# Patient Record
Sex: Female | Born: 1950 | Race: Black or African American | Hispanic: No | Smoking: Current every day smoker
Health system: Southern US, Community
[De-identification: ages and names within clinical notes are randomized; demographics above are authoritative.]

## PROBLEM LIST (undated history)

## (undated) DIAGNOSIS — R7611 Nonspecific reaction to tuberculin skin test without active tuberculosis: Secondary | ICD-10-CM

## (undated) DIAGNOSIS — I10 Essential (primary) hypertension: Secondary | ICD-10-CM

## (undated) DIAGNOSIS — N6009 Solitary cyst of unspecified breast: Secondary | ICD-10-CM

## (undated) DIAGNOSIS — F32A Depression, unspecified: Secondary | ICD-10-CM

## (undated) DIAGNOSIS — L4050 Arthropathic psoriasis, unspecified: Secondary | ICD-10-CM

## (undated) DIAGNOSIS — E669 Obesity, unspecified: Secondary | ICD-10-CM

## (undated) DIAGNOSIS — J310 Chronic rhinitis: Secondary | ICD-10-CM

## (undated) DIAGNOSIS — J45909 Unspecified asthma, uncomplicated: Secondary | ICD-10-CM

## (undated) DIAGNOSIS — K449 Diaphragmatic hernia without obstruction or gangrene: Secondary | ICD-10-CM

## (undated) DIAGNOSIS — G4730 Sleep apnea, unspecified: Secondary | ICD-10-CM

## (undated) DIAGNOSIS — L309 Dermatitis, unspecified: Secondary | ICD-10-CM

## (undated) DIAGNOSIS — L409 Psoriasis, unspecified: Secondary | ICD-10-CM

## (undated) DIAGNOSIS — E0500 Thyrotoxicosis with diffuse goiter without thyrotoxic crisis or storm: Secondary | ICD-10-CM

## (undated) DIAGNOSIS — F419 Anxiety disorder, unspecified: Secondary | ICD-10-CM

## (undated) DIAGNOSIS — F329 Major depressive disorder, single episode, unspecified: Secondary | ICD-10-CM

## (undated) DIAGNOSIS — K219 Gastro-esophageal reflux disease without esophagitis: Secondary | ICD-10-CM

## (undated) DIAGNOSIS — D259 Leiomyoma of uterus, unspecified: Secondary | ICD-10-CM

## (undated) HISTORY — PX: TUBAL LIGATION: SHX77

## (undated) HISTORY — PX: ABDOMINAL HYSTERECTOMY: SHX81

## (undated) HISTORY — PX: NM I-131 ABLATION W/THYROGEN (ARMC HX): HXRAD1231

## (undated) HISTORY — PX: COLONOSCOPY WITH ESOPHAGOGASTRODUODENOSCOPY (EGD): SHX5779

## (undated) HISTORY — PX: CHOLECYSTECTOMY: SHX55

## (undated) HISTORY — PX: APPENDECTOMY: SHX54

---

## 2004-01-03 ENCOUNTER — Ambulatory Visit: Payer: Self-pay | Admitting: Internal Medicine

## 2005-02-05 ENCOUNTER — Ambulatory Visit: Payer: Self-pay | Admitting: Internal Medicine

## 2006-03-11 ENCOUNTER — Ambulatory Visit: Payer: Self-pay | Admitting: Internal Medicine

## 2007-03-31 ENCOUNTER — Ambulatory Visit: Payer: Self-pay | Admitting: Internal Medicine

## 2008-10-28 ENCOUNTER — Emergency Department: Payer: Self-pay | Admitting: Emergency Medicine

## 2008-11-09 ENCOUNTER — Ambulatory Visit: Payer: Self-pay | Admitting: Urology

## 2009-07-18 ENCOUNTER — Ambulatory Visit: Payer: Self-pay | Admitting: Internal Medicine

## 2010-07-05 IMAGING — CT CT ABD-PELV W/O CM
1 of 3 series · 14 of 32 positions shown, 19 images · non-contrast
Comparison: None

REASON FOR EXAM: (1) pain  oral contrast only  no IV; (2) Erxleben
COMMENTS:

PROCEDURE:     CT  - CT ABDOMEN AND PELVIS W[DATE]  [DATE]
RESULT:     Indication: Pain
TECHNIQUE: Multiple axial images from the lung bases to the symphysis pubis
were obtained with oral and without intravenous contrast.

[Series 5: 3mm · axial · 0.83mm/px · z∈[+131,+530]mm · 14 of 151 slices shown, 19 images]
[im 9/151  soft-tissue]
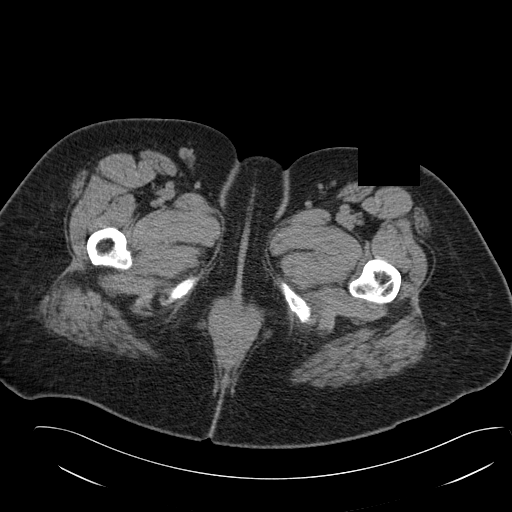
[im 9/151  bone]
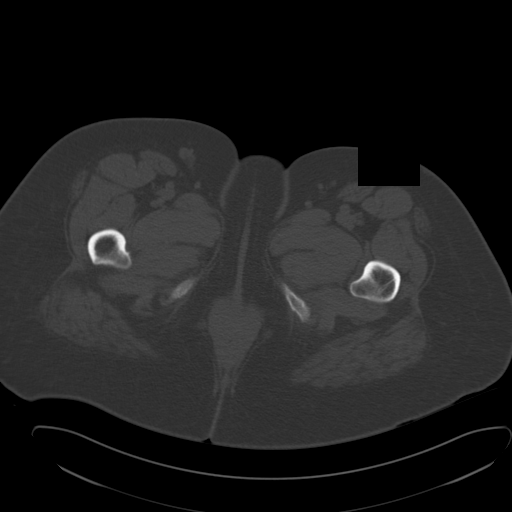
[im 18/151  soft-tissue]
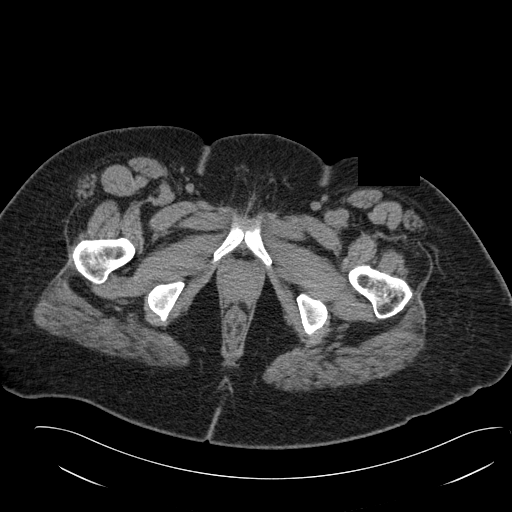
[im 36/151  soft-tissue]
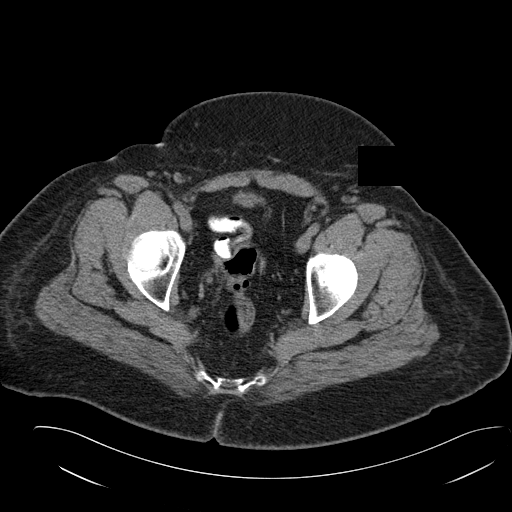
[im 45/151  soft-tissue]
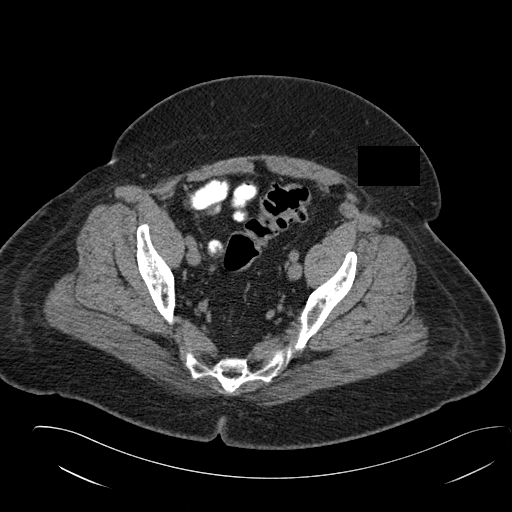
[im 53/151  soft-tissue]
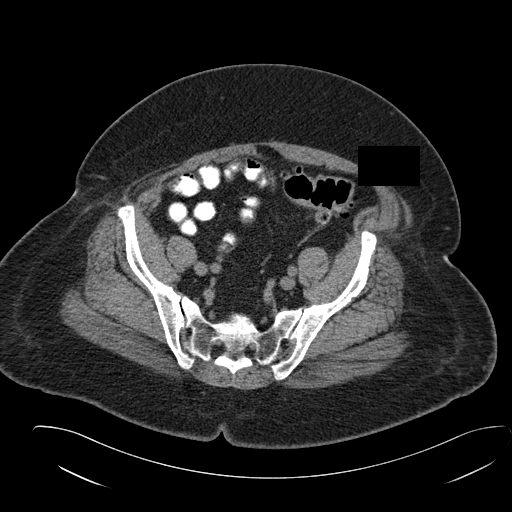
[im 62/151  soft-tissue]
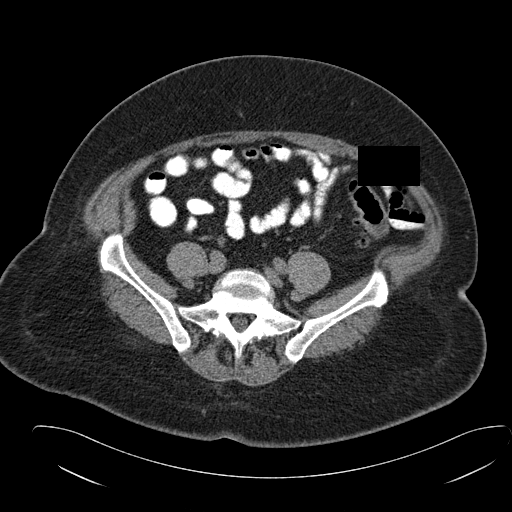
[im 80/151  soft-tissue]
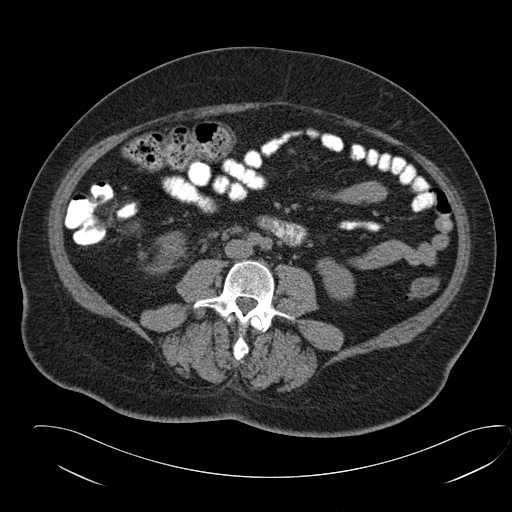
[im 89/151  soft-tissue]
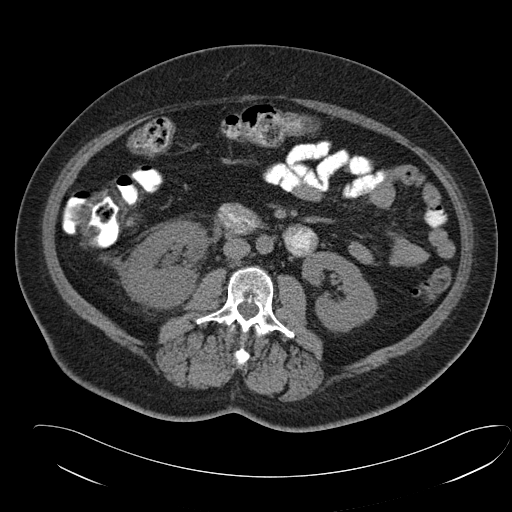
[im 98/151  soft-tissue]
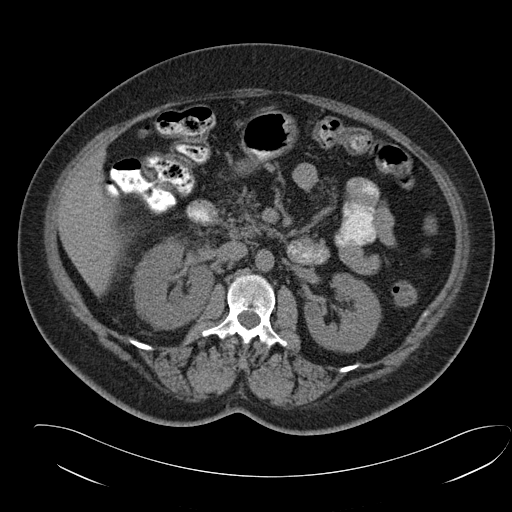
[im 98/151  bone]
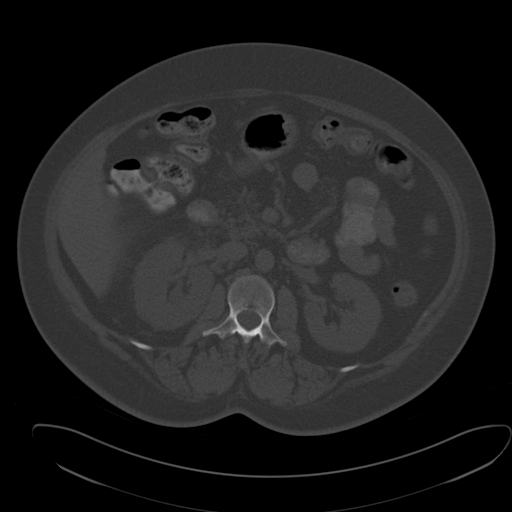
[im 106/151  soft-tissue]
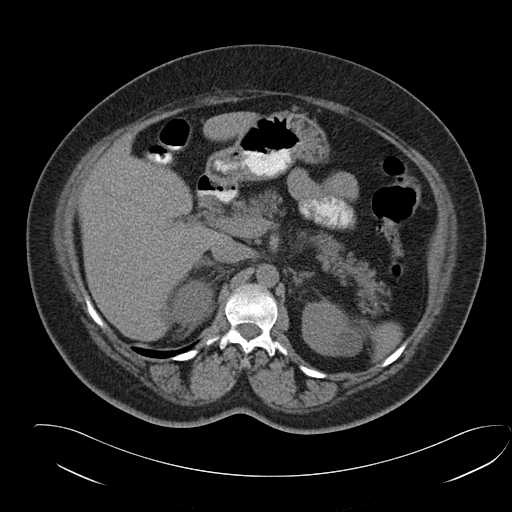
[im 115/151  soft-tissue]
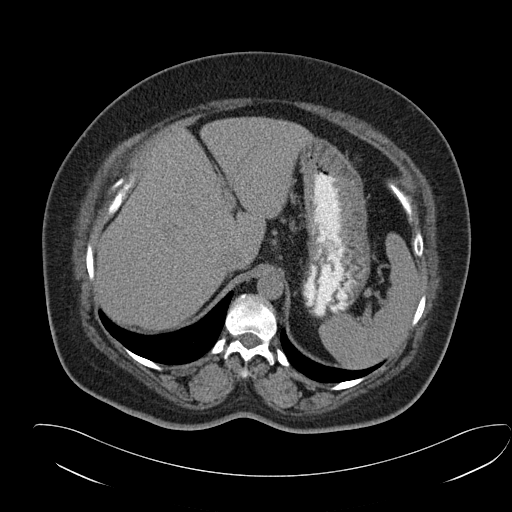
[im 115/151  lung]
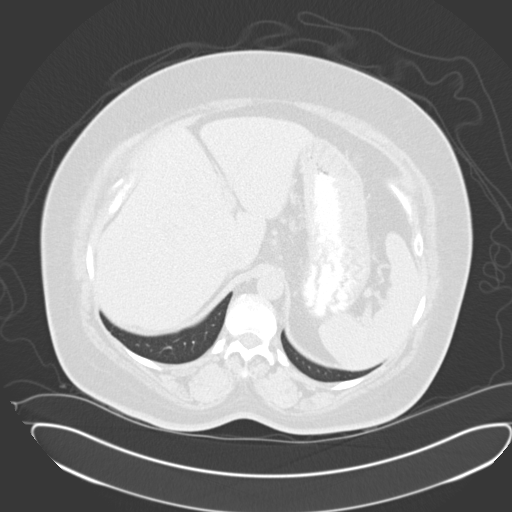
[im 124/151  lung]
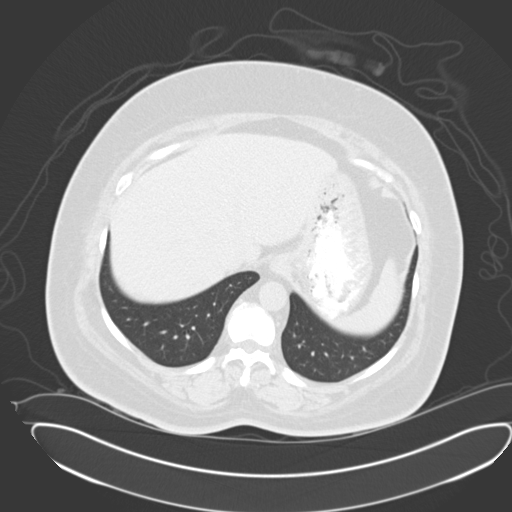
[im 133/151  soft-tissue]
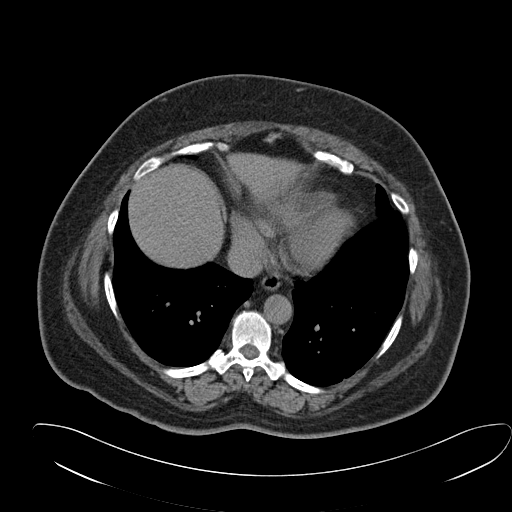
[im 133/151  lung]
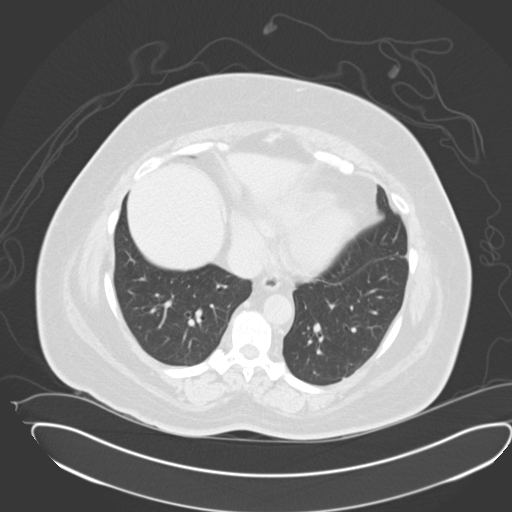
[im 142/151  soft-tissue]
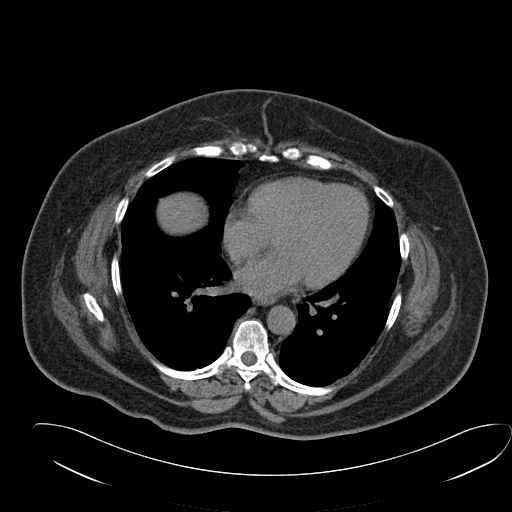
[im 142/151  lung]
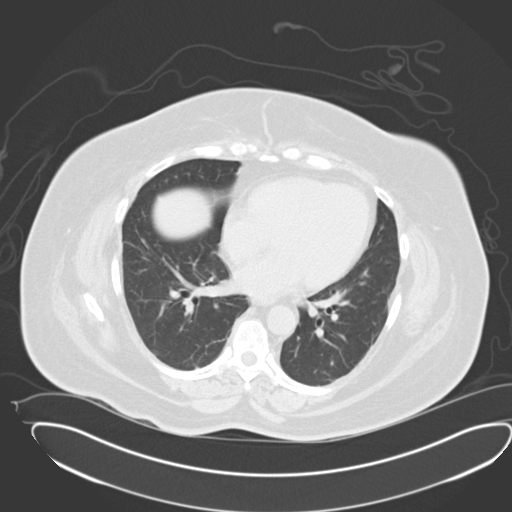

[14 of 32 positions shown; findings below may reference images not displayed]

FINDINGS: The lung bases are clear. There is no pleural or pericardial effusions.

There is a punctate 2 mm right UVJ calculus. There is mild caliectasis.
There is mild right nephromegaly with perinephric stranding. There are
punctate bilateral nonobstructing renal calculi. There is a 2.7 cm
hypodense, fluid attenuating left renal mass most consistent with a cyst.
The bladder is unremarkable.

The liver demonstrates no focal abnormality. The gallbladder is
unremarkable. The spleen demonstrates no focal abnormality. The adrenal
glands and pancreas are normal.

The stomach, duodenum, small intestine, and large intestine are
unremarkable. There is diverticulosis without evidence of diverticulitis.
There is no pneumoperitoneum, pneumatosis, or portal venous gas. There is no
abdominal or pelvic free fluid. There is no lymphadenopathy.

The abdominal aorta is normal in caliber.

The osseous structures are unremarkable.
IMPRESSION: 1. There is a 2 mm distal right UVJ calculus with mild right caliectasis.

## 2010-07-17 IMAGING — CR DG ABDOMEN 1V
1 series · 2 of 2 positions shown · non-contrast
Comparison: none

REASON FOR EXAM: NEPHROLITHIASIS, PLEASE GIVE FILMS TO PATIENT
COMMENTS:

[Series 1: view not recorded · 0.17mm/px · 2 of 2 slices shown]
[im 1/2]
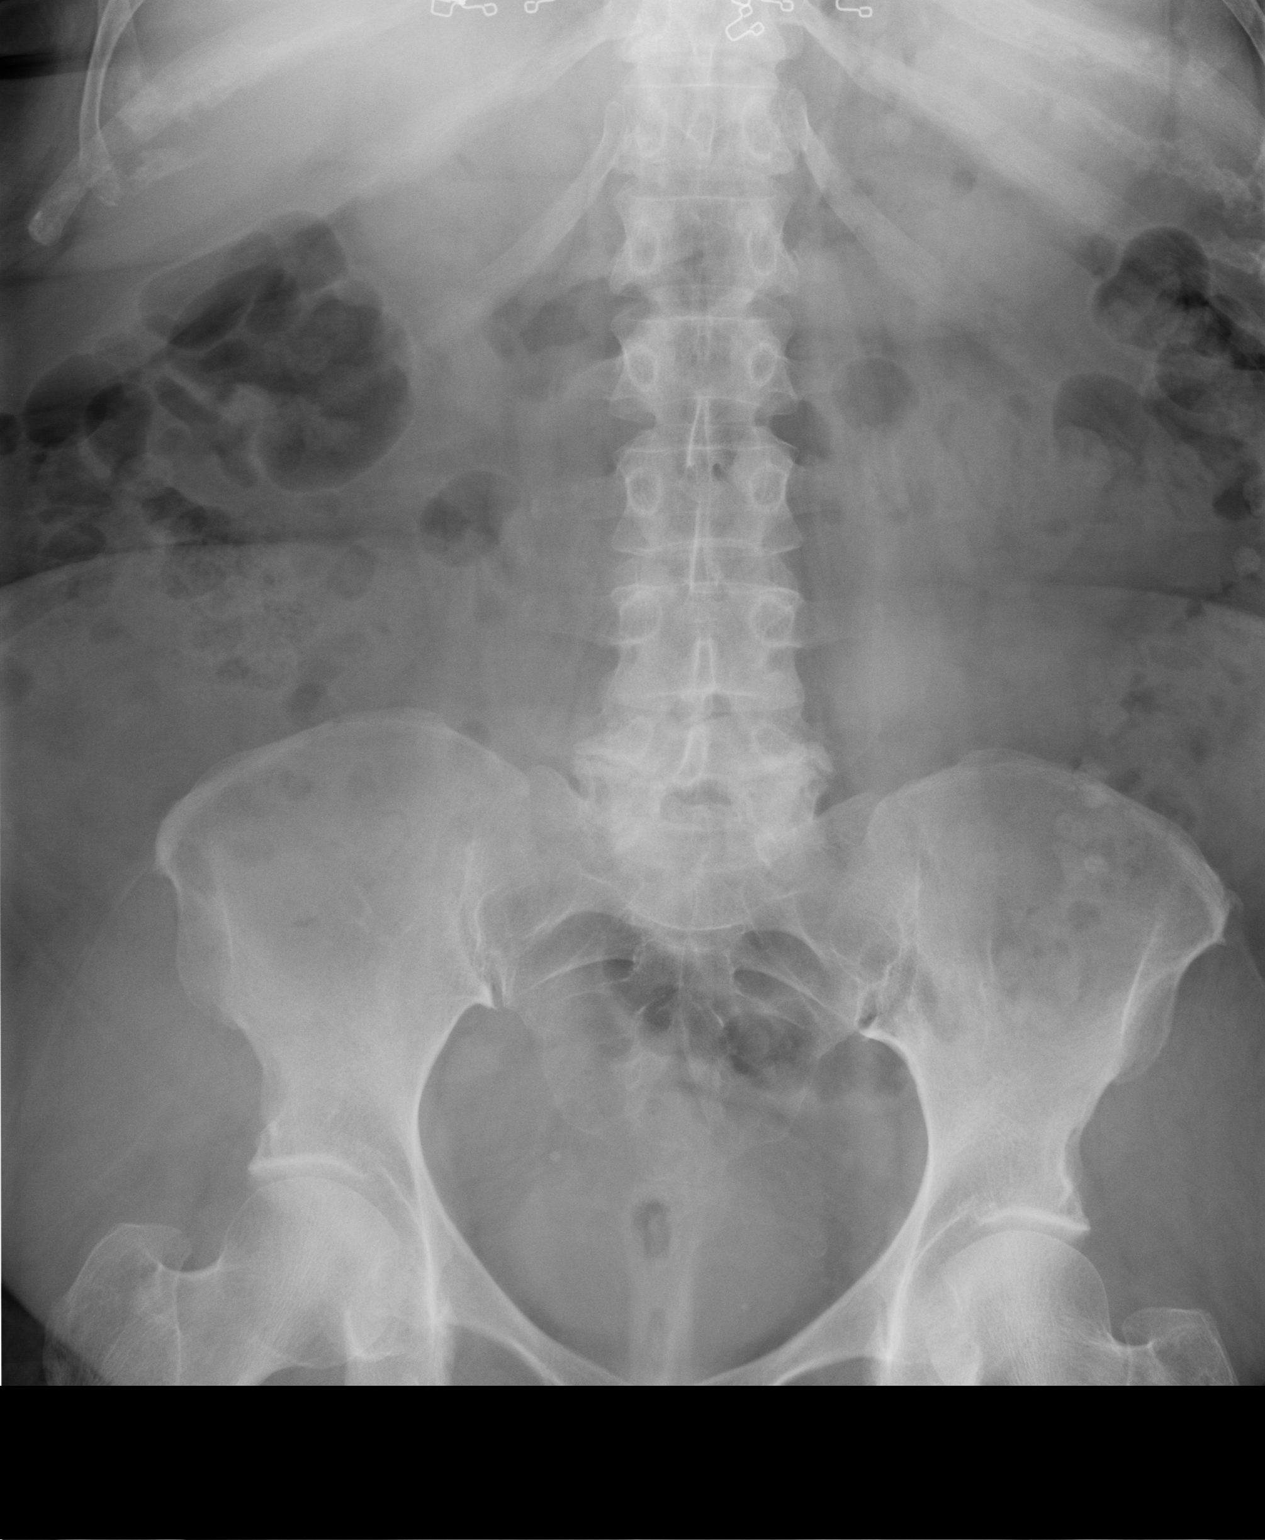
[im 2/2]
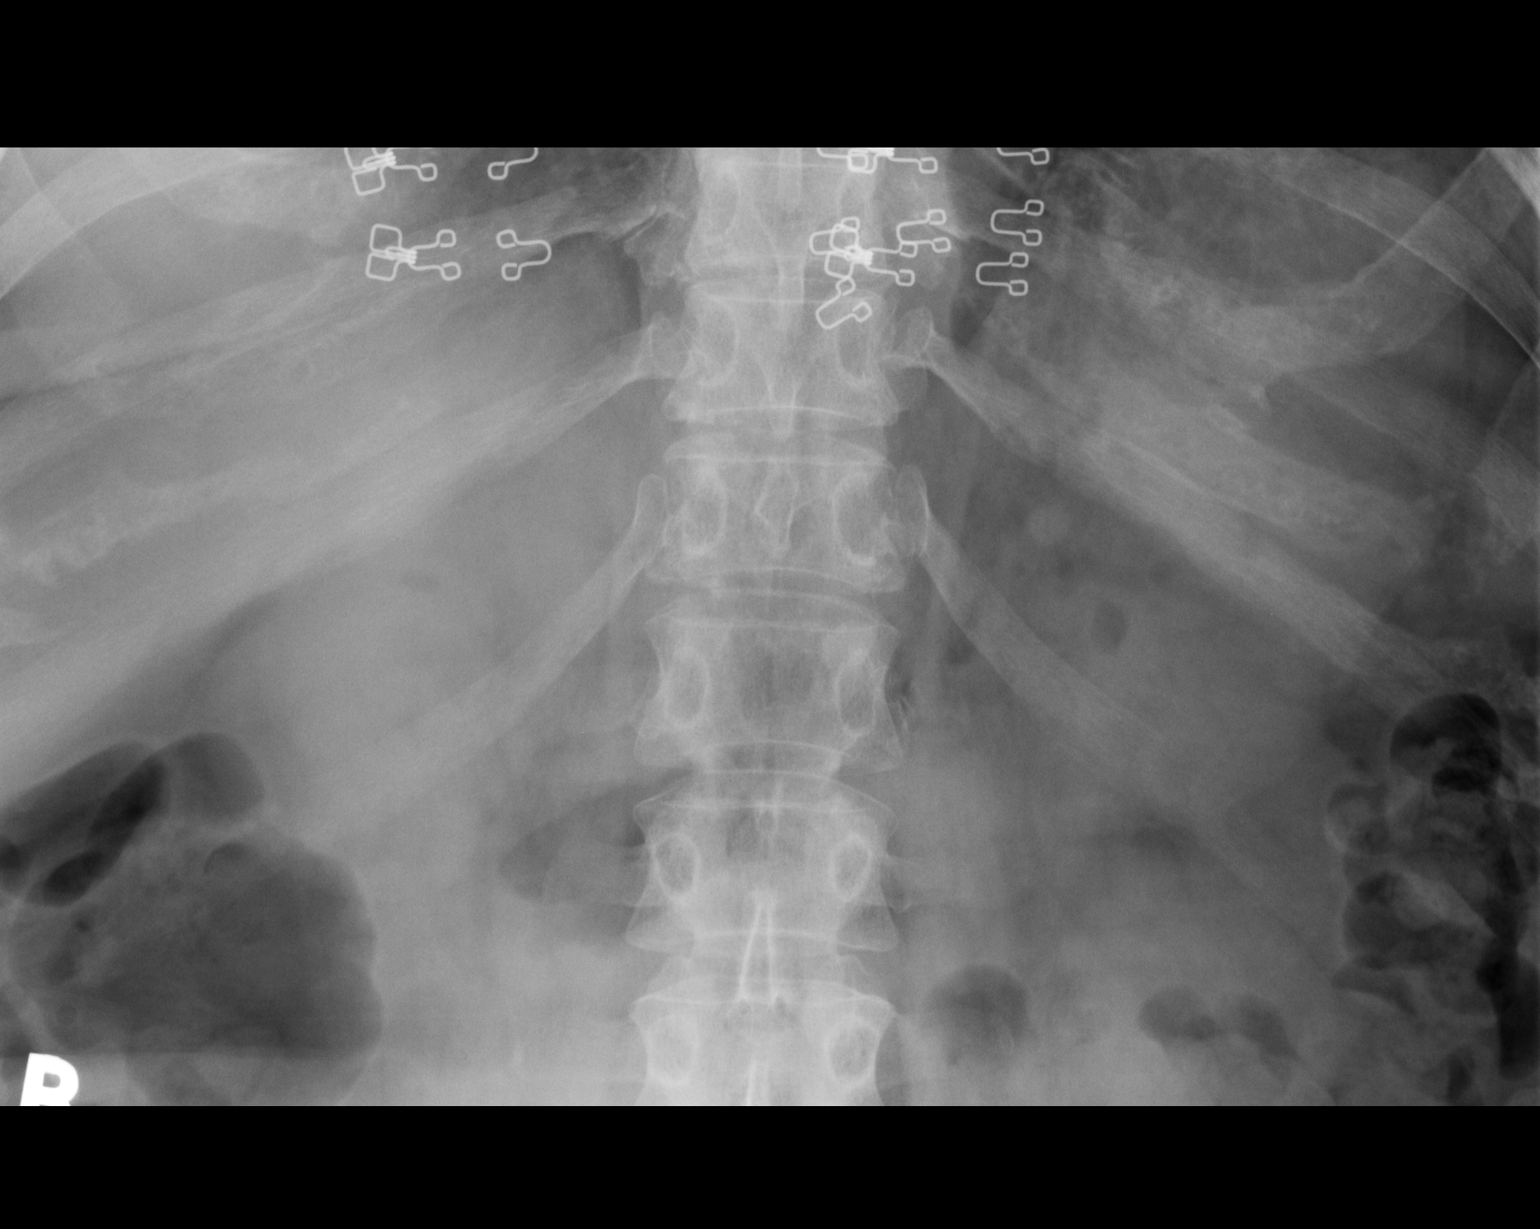

[2 of 2 positions shown; findings below may reference images not displayed]

PROCEDURE:     DXR - DXR KIDNEY URETER BLADDER  - November 09, 2008  [DATE]

RESULT:     There is a round 2 mm calcification in the right pelvis
projected near the right ureterovesical junction and which could represent
the distal right ureteral stone observed at prior CT. No other renal or
ureteral calcifications are identified. The tiny punctate renal
calcifications noted bilaterally at prior CT are not evident on routine
radiography.
IMPRESSION: Possible distal right ureteral stone.

## 2010-08-26 ENCOUNTER — Ambulatory Visit: Payer: Self-pay | Admitting: Internal Medicine

## 2012-05-10 ENCOUNTER — Ambulatory Visit: Payer: Self-pay

## 2018-02-22 ENCOUNTER — Encounter: Payer: Self-pay | Admitting: *Deleted

## 2018-02-23 ENCOUNTER — Ambulatory Visit: Payer: Medicare HMO | Admitting: Anesthesiology

## 2018-02-23 ENCOUNTER — Encounter: Payer: Self-pay | Admitting: *Deleted

## 2018-02-23 ENCOUNTER — Encounter
Admission: RE | Disposition: A | Discharge status: Home / Self Care | Payer: Self-pay | Source: Ambulatory Visit | Attending: Internal Medicine

## 2018-02-23 ENCOUNTER — Other Ambulatory Visit: Payer: Self-pay

## 2018-02-23 ENCOUNTER — Ambulatory Visit
Admission: RE | Admit: 2018-02-23 | Discharge: 2018-02-23 | Disposition: A | Discharge status: Home / Self Care | Payer: Medicare HMO | Source: Ambulatory Visit | Attending: Internal Medicine | Admitting: Internal Medicine

## 2018-02-23 DIAGNOSIS — D120 Benign neoplasm of cecum: Secondary | ICD-10-CM | POA: Diagnosis not present

## 2018-02-23 DIAGNOSIS — R195 Other fecal abnormalities: Secondary | ICD-10-CM | POA: Insufficient documentation

## 2018-02-23 DIAGNOSIS — Z79899 Other long term (current) drug therapy: Secondary | ICD-10-CM | POA: Diagnosis not present

## 2018-02-23 DIAGNOSIS — K640 First degree hemorrhoids: Secondary | ICD-10-CM | POA: Diagnosis not present

## 2018-02-23 DIAGNOSIS — K5730 Diverticulosis of large intestine without perforation or abscess without bleeding: Secondary | ICD-10-CM | POA: Diagnosis not present

## 2018-02-23 DIAGNOSIS — I10 Essential (primary) hypertension: Secondary | ICD-10-CM | POA: Insufficient documentation

## 2018-02-23 DIAGNOSIS — E0500 Thyrotoxicosis with diffuse goiter without thyrotoxic crisis or storm: Secondary | ICD-10-CM | POA: Insufficient documentation

## 2018-02-23 DIAGNOSIS — D125 Benign neoplasm of sigmoid colon: Secondary | ICD-10-CM | POA: Diagnosis not present

## 2018-02-23 DIAGNOSIS — D123 Benign neoplasm of transverse colon: Secondary | ICD-10-CM | POA: Diagnosis not present

## 2018-02-23 DIAGNOSIS — Z882 Allergy status to sulfonamides status: Secondary | ICD-10-CM | POA: Diagnosis not present

## 2018-02-23 DIAGNOSIS — Z79890 Hormone replacement therapy (postmenopausal): Secondary | ICD-10-CM | POA: Insufficient documentation

## 2018-02-23 HISTORY — DX: Chronic rhinitis: J31.0

## 2018-02-23 HISTORY — DX: Solitary cyst of unspecified breast: N60.09

## 2018-02-23 HISTORY — DX: Dermatitis, unspecified: L30.9

## 2018-02-23 HISTORY — DX: Psoriasis, unspecified: L40.9

## 2018-02-23 HISTORY — DX: Depression, unspecified: F32.A

## 2018-02-23 HISTORY — DX: Anxiety disorder, unspecified: F41.9

## 2018-02-23 HISTORY — DX: Diaphragmatic hernia without obstruction or gangrene: K44.9

## 2018-02-23 HISTORY — DX: Obesity, unspecified: E66.9

## 2018-02-23 HISTORY — DX: Essential (primary) hypertension: I10

## 2018-02-23 HISTORY — DX: Unspecified asthma, uncomplicated: J45.909

## 2018-02-23 HISTORY — DX: Nonspecific reaction to tuberculin skin test without active tuberculosis: R76.11

## 2018-02-23 HISTORY — DX: Sleep apnea, unspecified: G47.30

## 2018-02-23 HISTORY — DX: Arthropathic psoriasis, unspecified: L40.50

## 2018-02-23 HISTORY — DX: Leiomyoma of uterus, unspecified: D25.9

## 2018-02-23 HISTORY — PX: COLONOSCOPY WITH PROPOFOL: SHX5780

## 2018-02-23 HISTORY — DX: Thyrotoxicosis with diffuse goiter without thyrotoxic crisis or storm: E05.00

## 2018-02-23 HISTORY — DX: Gastro-esophageal reflux disease without esophagitis: K21.9

## 2018-02-23 HISTORY — DX: Major depressive disorder, single episode, unspecified: F32.9

## 2018-02-23 SURGERY — COLONOSCOPY WITH PROPOFOL
Anesthesia: General

## 2018-02-23 MED ORDER — SODIUM CHLORIDE 0.9 % IV SOLN
INTRAVENOUS | Status: DC
  Administered 2018-02-23: 11:00:00 via INTRAVENOUS

## 2018-02-23 MED ORDER — LIDOCAINE HCL (CARDIAC) PF 100 MG/5ML IV SOSY
PREFILLED_SYRINGE | INTRAVENOUS | Status: DC | PRN
  Administered 2018-02-23: 100 mg via INTRAVENOUS

## 2018-02-23 MED ORDER — PROPOFOL 10 MG/ML IV BOLUS
INTRAVENOUS | Status: DC | PRN
  Administered 2018-02-23: 100 mg via INTRAVENOUS

## 2018-02-23 MED ORDER — PROPOFOL 500 MG/50ML IV EMUL
INTRAVENOUS | Status: DC | PRN
  Administered 2018-02-23: 150 ug/kg/min via INTRAVENOUS

## 2018-02-23 NOTE — Anesthesia Preprocedure Evaluation (Signed)
Anesthesia Evaluation  Patient identified by MRN, date of birth, ID band Patient awake    Reviewed: Allergy & Precautions, H&P , NPO status , Patient's Chart, lab work & pertinent test results, reviewed documented beta blocker date and time   History of Anesthesia Complications Negative for: history of anesthetic complications  Airway Mallampati: III  TM Distance: >3 FB Neck ROM: full    Dental  (+) Dental Advidsory Given, Teeth Intact, Missing, Chipped   Pulmonary shortness of breath and with exertion, asthma , sleep apnea (BiPap) , COPD, neg recent URI, Current Smoker,           Cardiovascular Exercise Tolerance: Good hypertension, (-) angina(-) CAD, (-) Past MI, (-) Cardiac Stents and (-) CABG (-) dysrhythmias (-) Valvular Problems/Murmurs     Neuro/Psych PSYCHIATRIC DISORDERS Anxiety Depression negative neurological ROS     GI/Hepatic Neg liver ROS, hiatal hernia, GERD  ,  Endo/Other  diabetesHypothyroidism Morbid obesity  Renal/GU negative Renal ROS  negative genitourinary   Musculoskeletal   Abdominal   Peds  Hematology negative hematology ROS (+)   Anesthesia Other Findings Past Medical History: No date: Anxiety No date: Breast cyst No date: Depression No date: Eczema No date: GERD (gastroesophageal reflux disease) No date: Graves disease No date: Hiatal hernia No date: Hypertension No date: Obesity 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 No date: Psoriasis No date: Psoriatic arthritis (HCC) No date: RAD (reactive airway disease) No date: Rhinitis No date: Sleep apnea No date: Uterine fibroid   Reproductive/Obstetrics negative OB ROS                             Anesthesia Physical Anesthesia Plan  ASA: III  Anesthesia Plan: General   Post-op Pain Management:    Induction: Intravenous  PONV Risk Score and Plan: 2 and Propofol infusion and TIVA  Airway Management Planned: Nasal  Cannula and Natural Airway  Additional Equipment:   Intra-op Plan:   Post-operative Plan:   Informed Consent: I have reviewed the patients History and Physical, chart, labs and discussed the procedure including the risks, benefits and alternatives for the proposed anesthesia with the patient or authorized representative who has indicated his/her understanding and acceptance.   Dental Advisory Given  Plan Discussed with: Anesthesiologist, CRNA and Surgeon  Anesthesia Plan Comments:         Anesthesia Quick Evaluation

## 2018-02-23 NOTE — H&P (Signed)
Outpatient short stay form Pre-procedure 02/23/2018 10:17 AM Ashley Webster K. Alice Reichert, M.D.  Primary Physician: Fulton Reek, M.D.  Reason for visit:  Positive Cologuard test  History of present illness:  Patient presents for colonoscopy for positive cologuard testing. Patient denies change in bowel habits, rectal bleeding, weight loss or abdominal pain.    No current facility-administered medications for this encounter.   Medications Prior to Admission  Medication Sig Dispense Refill Last Dose  . albuterol (PROVENTIL HFA;VENTOLIN HFA) 108 (90 Base) MCG/ACT inhaler Inhale into the lungs every 6 (six) hours as needed for wheezing or shortness of breath.     Marland Kitchen alendronate (FOSAMAX) 70 MG tablet Take 70 mg by mouth once a week. Take with a full glass of water on an empty stomach.     Marland Kitchen alendronate (FOSAMAX) 70 MG/75ML solution Take 70 mg by mouth every 7 (seven) days. Take with a full glass of water on an empty stomach.     . felodipine (PLENDIL) 10 MG 24 hr tablet Take 10 mg by mouth daily.     . fluticasone (FLONASE) 50 MCG/ACT nasal spray Place into both nostrils daily.     Marland Kitchen levothyroxine (SYNTHROID, LEVOTHROID) 100 MCG tablet Take 100 mcg by mouth daily before breakfast.     . sitaGLIPtin (JANUVIA) 100 MG tablet Take 100 mg by mouth daily.     . traZODone (DESYREL) 50 MG tablet Take 50 mg by mouth at bedtime.        Allergies  Allergen Reactions  . Hctz [Hydrochlorothiazide]   . Sulfa Antibiotics      Past Medical History:  Diagnosis Date  . Anxiety   . Breast cyst   . Depression   . Eczema   . GERD (gastroesophageal reflux disease)   . Graves disease   . Hiatal hernia   . Hypertension   . Obesity   . PPD positive   . Psoriasis   . Psoriatic arthritis ([place])   . RAD (reactive airway disease)   . Rhinitis   . Sleep apnea   . Uterine fibroid     Review of systems:  Otherwise negative.    Physical Exam  Gen: Alert, oriented. Appears stated age.  HEENT: /AT.  PERRLA. Lungs: CTA, no wheezes. CV: RR nl S1, S2. Abd: soft, benign, no masses. BS+ Ext: No edema. Pulses 2+    Planned procedures: Proceed with colonoscopy. The patient understands the nature of the planned procedure, indications, risks, alternatives and potential complications including but not limited to bleeding, infection, perforation, damage to internal organs and possible oversedation/side effects from anesthesia. The patient agrees and gives consent to proceed.  Please refer to procedure notes for findings, recommendations and patient disposition/instructions.     Carmencita Cusic K. Alice Reichert, M.D. Gastroenterology 02/23/2018  10:17 AM

## 2018-02-23 NOTE — Interval H&P Note (Signed)
History and Physical Interval Note:  02/23/2018 10:18 AM  Louellen Molder  has presented today for surgery, with the diagnosis of positive cologuard  The various methods of treatment have been discussed with the patient and family. After consideration of risks, benefits and other options for treatment, the patient has consented to  Procedure(s): COLONOSCOPY WITH PROPOFOL (N/A) as a surgical intervention .  The patient's history has been reviewed, patient examined, no change in status, stable for surgery.  I have reviewed the patient's chart and labs.  Questions were answered to the patient's satisfaction.     [place], Wel[place]

## 2018-02-23 NOTE — Transfer of Care (Signed)
Immediate Anesthesia Transfer of Care Note  Patient: Ashley Webster  Procedure(s) Performed: COLONOSCOPY WITH PROPOFOL (N/A )  Patient Location: Endoscopy Unit  Anesthesia Type:General  Level of Consciousness: sedated  Airway & Oxygen Therapy: Patient Spontanous Breathing and Patient connected to nasal cannula oxygen  Post-op Assessment: Report given to RN and Post -op Vital signs reviewed and stable  Post vital signs: Reviewed and stable  Last Vitals:  Vitals Value Taken Time  BP 110/80 02/23/2018 12:00 PM  Temp 36.2 C 02/23/2018 12:00 PM  Pulse 71 02/23/2018 12:01 PM  Resp 18 02/23/2018 12:01 PM  SpO2 94 % 02/23/2018 12:01 PM  Vitals shown include unvalidated device data.  Last Pain:  Vitals:   02/23/18 1200  TempSrc: Tympanic  PainSc:          Complications: No apparent anesthesia complications

## 2018-02-23 NOTE — Anesthesia Post-op Follow-up Note (Signed)
Anesthesia QCDR form completed.        

## 2018-02-24 ENCOUNTER — Encounter: Payer: Self-pay | Admitting: Internal Medicine

## 2018-02-24 NOTE — Anesthesia Postprocedure Evaluation (Signed)
Anesthesia Post Note  Patient: Ashley Webster  Procedure(s) Performed: COLONOSCOPY WITH PROPOFOL (N/A )  Patient location during evaluation: Endoscopy Anesthesia Type: General Level of consciousness: awake and alert Pain management: pain level controlled Vital Signs Assessment: post-procedure vital signs reviewed and stable Respiratory status: spontaneous breathing, nonlabored ventilation, respiratory function stable and patient connected to nasal cannula oxygen Cardiovascular status: blood pressure returned to baseline and stable Postop Assessment: no apparent nausea or vomiting Anesthetic complications: no     Last Vitals:  Vitals:   02/23/18 1220 02/23/18 1230  BP: 117/70 122/70  Pulse: 63 63  Resp: 17 (!) 21  Temp:    SpO2: 98% 98%    Last Pain:  Vitals:   02/23/18 1230  TempSrc:   PainSc: 0-No pain                 Martha Clan

## 2018-02-24 NOTE — Op Note (Signed)
[facility] Gastroenterology Patient Name: Ashley Webster Procedure Date: 02/23/2018 11:28 AM MRN: [id number] Account #: [id number] Date of Birth: 1951/01/18 Admit Type: Outpatient Age: 67 Room: [facility] ENDO ROOM 3 Gender: Female Note Status: Finalized Procedure:            Colonoscopy Indications:          Positive Cologuard test Providers:            Benay Pike. Alice Reichert MD, MD Referring MD:         Leonie Douglas. Doy Hutching, MD (Referring MD) Medicines:            Propofol per Anesthesia Complications:        No immediate complications. Procedure:            Pre-Anesthesia Assessment:                       - The risks and benefits of the procedure and the                        sedation options and risks were discussed with the                        patient. All questions were answered and informed                        consent was obtained.                       - Patient identification and proposed procedure were                        verified prior to the procedure by the nurse. The                        procedure was verified in the procedure room.                       - ASA Grade Assessment: III - A patient with severe                        systemic disease.                       - After reviewing the risks and benefits, the patient                        was deemed in satisfactory condition to undergo the                        procedure.                       After obtaining informed consent, the colonoscope was                        passed under direct vision. Throughout the procedure,                        the patient's blood pressure, pulse, and oxygen  saturations were monitored continuously. The                        Colonoscope was introduced through the anus and                        advanced to the the cecum, identified by appendiceal                        orifice and ileocecal valve. The colonoscopy was    performed without difficulty. The patient tolerated the                        procedure well. The quality of the bowel preparation                        was good. The ileocecal valve, appendiceal orifice, and                        rectum were photographed. Findings:      The perianal and digital rectal examinations were normal. Pertinent       negatives include normal sphincter tone and no palpable rectal lesions.      Many small and large-mouthed diverticula were found in the sigmoid colon.      Two sessile polyps were found in the hepatic flexure and cecum. The       polyps were 3 to 4 mm in size. These polyps were removed with a jumbo       cold forceps. Resection and retrieval were complete.      A 7 mm polyp was found in the sigmoid colon. The polyp was       semi-pedunculated. The polyp was removed with a hot snare. Resection and       retrieval were complete.      Non-bleeding internal hemorrhoids were found during retroflexion. The       hemorrhoids were Grade I (internal hemorrhoids that do not prolapse).      The exam was otherwise without abnormality. Impression:           - Diverticulosis in the sigmoid colon.                       - Two 3 to 4 mm polyps at the hepatic flexure and in                        the cecum, removed with a jumbo cold forceps. Resected                        and retrieved.                       - One 7 mm polyp in the sigmoid colon, removed with a                        hot snare. Resected and retrieved.                       - Non-bleeding internal hemorrhoids.                       - The examination was otherwise  normal. Recommendation:       - Patient has a contact number available for                        emergencies. The signs and symptoms of potential                        delayed complications were discussed with the patient.                        Return to normal activities tomorrow. Written discharge                         instructions were provided to the patient.                       - Resume previous diet.                       - Continue present medications.                       - Await pathology results.                       - Repeat colonoscopy is recommended for surveillance.                        The colonoscopy date will be determined after pathology                        results from today's exam become available for review.                       - Return to GI office PRN.                       - The findings and recommendations were discussed with                        the patient and their family. Procedure Code(s):    --- Professional ---                       [id number], Colonoscopy, flexible; with removal of tumor(s),                        polyp(s), or other lesion(s) by snare technique                       45380, 33, Colonoscopy, flexible; with biopsy, single                        or multiple Diagnosis Code(s):    --- Professional ---                       K57.30, Diverticulosis of large intestine without                        perforation or abscess without bleeding                       R19.5, Other  fecal abnormalities                       D12.5, Benign neoplasm of sigmoid colon                       D12.0, Benign neoplasm of cecum                       D12.3, Benign neoplasm of transverse colon (hepatic                        flexure or splenic flexure)                       K64.0, First degree hemorrhoids CPT copyright 2018 [employer]. All rights reserved. The codes documented in this report are preliminary and upon coder review may  be revised to meet current compliance requirements. Efrain Sella MD, MD 02/23/2018 12:00:42 PM This report has been signed electronically. Number of Addenda: 0 Note Initiated On: 02/23/2018 11:28 AM Scope Withdrawal Time: 0 hours 8 minutes 10 seconds  Total Procedure Duration: 0 hours 12 minutes 10 seconds       [facility]

## 2018-02-28 LAB — SURGICAL PATHOLOGY

## 2019-12-07 ENCOUNTER — Other Ambulatory Visit: Payer: Self-pay | Admitting: Internal Medicine

## 2019-12-07 ENCOUNTER — Other Ambulatory Visit (HOSPITAL_COMMUNITY): Payer: Self-pay | Admitting: Internal Medicine

## 2019-12-07 DIAGNOSIS — M79652 Pain in left thigh: Secondary | ICD-10-CM

## 2019-12-15 ENCOUNTER — Other Ambulatory Visit: Payer: Self-pay

## 2019-12-15 ENCOUNTER — Ambulatory Visit
Admission: RE | Admit: 2019-12-15 | Discharge: 2019-12-15 | Disposition: A | Discharge status: Home / Self Care | Payer: Medicare HMO | Source: Ambulatory Visit | Attending: Internal Medicine | Admitting: Internal Medicine

## 2019-12-15 DIAGNOSIS — M79652 Pain in left thigh: Secondary | ICD-10-CM | POA: Insufficient documentation

## 2020-07-22 ENCOUNTER — Other Ambulatory Visit: Payer: Self-pay | Admitting: Internal Medicine

## 2020-07-22 DIAGNOSIS — Z1231 Encounter for screening mammogram for malignant neoplasm of breast: Secondary | ICD-10-CM

## 2021-08-21 IMAGING — US US EXTREM LOW VENOUS*L*
1 series · 13 of 24 positions shown · non-contrast
Comparison: None.

CLINICAL DATA: Left lower extremity pain



[Series 1: us venous img lower uni left (dvt) · portal-venous · 13 of 35 slices shown]
[im 1/35]
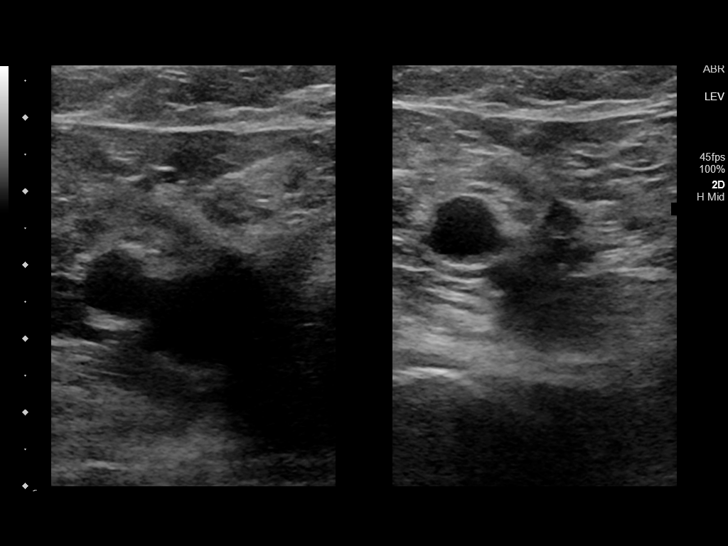
[im 3/35]
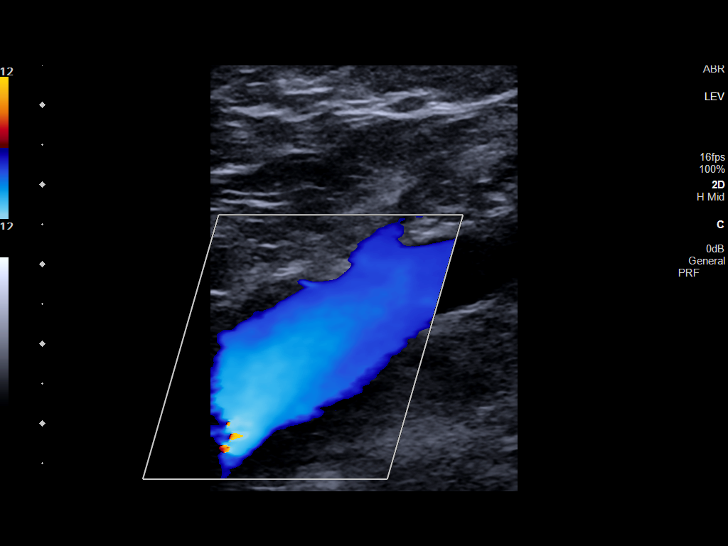
[im 6/35]
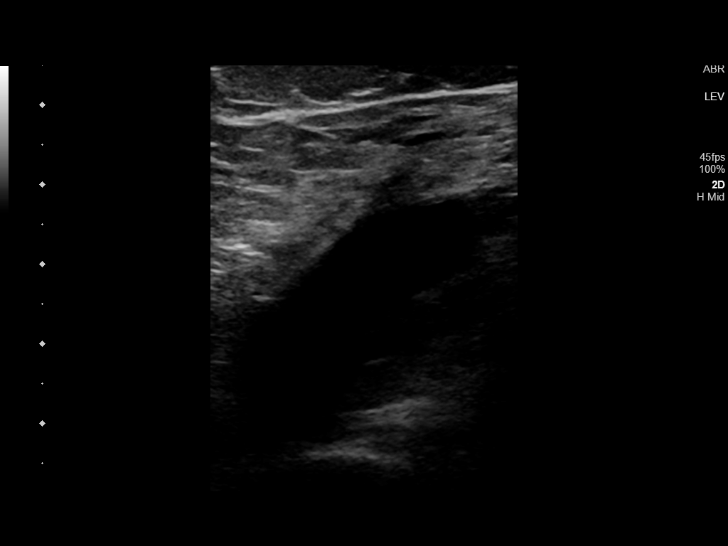
[im 9/35]
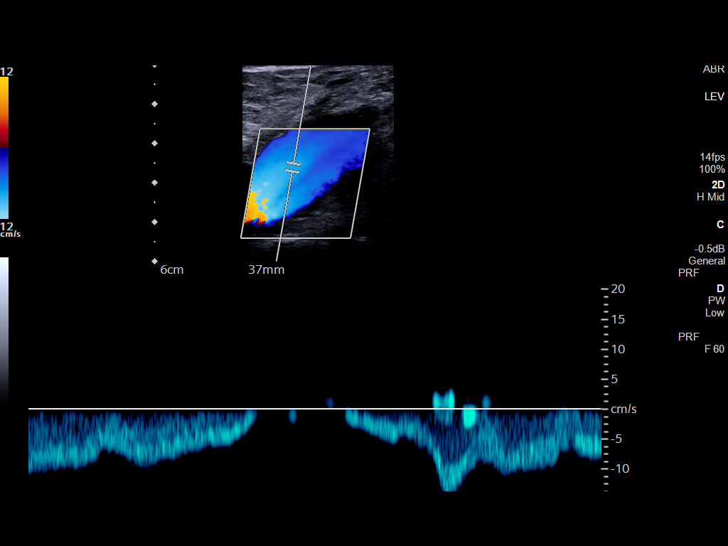
[im 12/35]
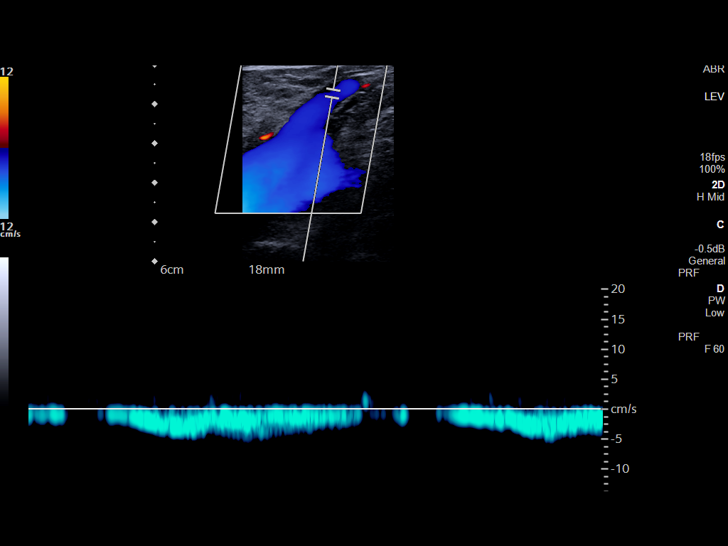
[im 15/35]
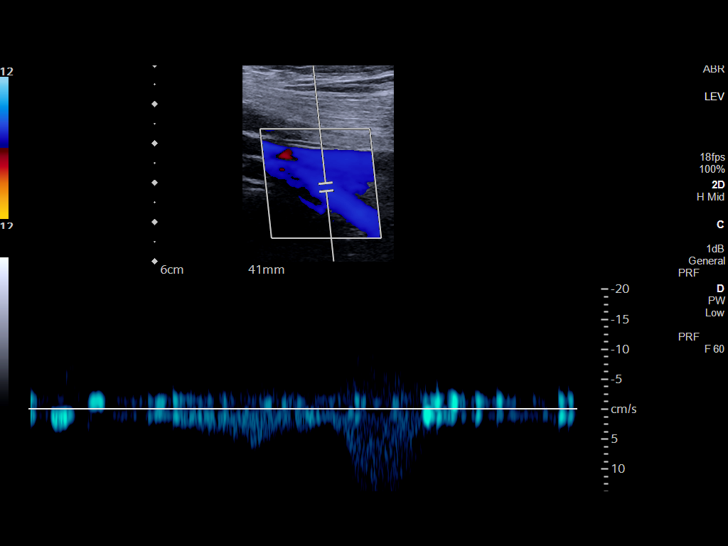
[im 18/35]
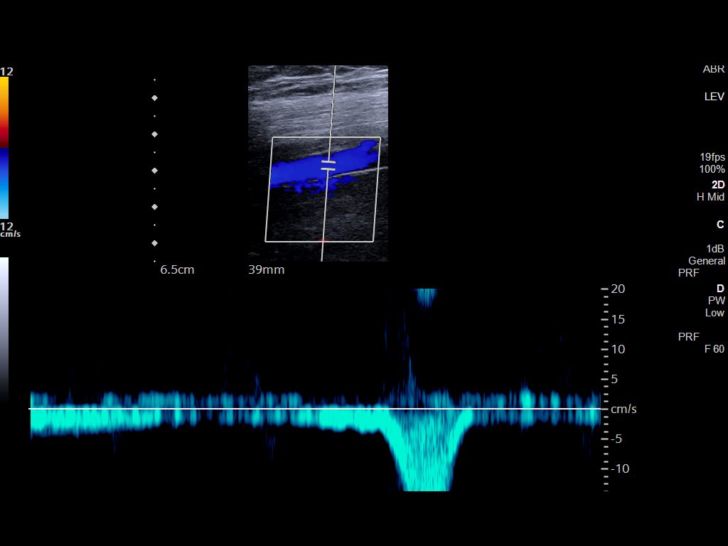
[im 20/35]
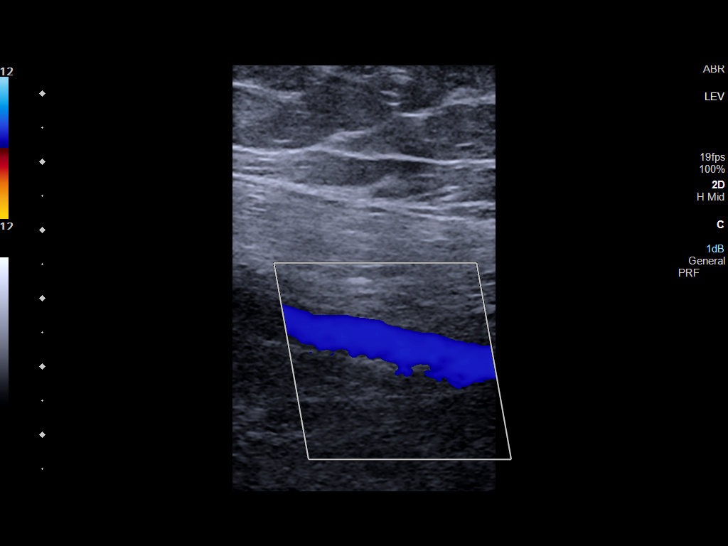
[im 23/35]
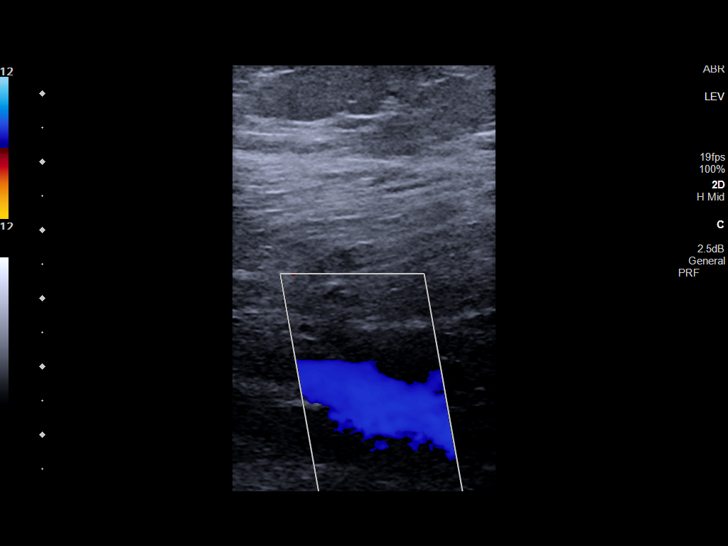
[im 26/35]
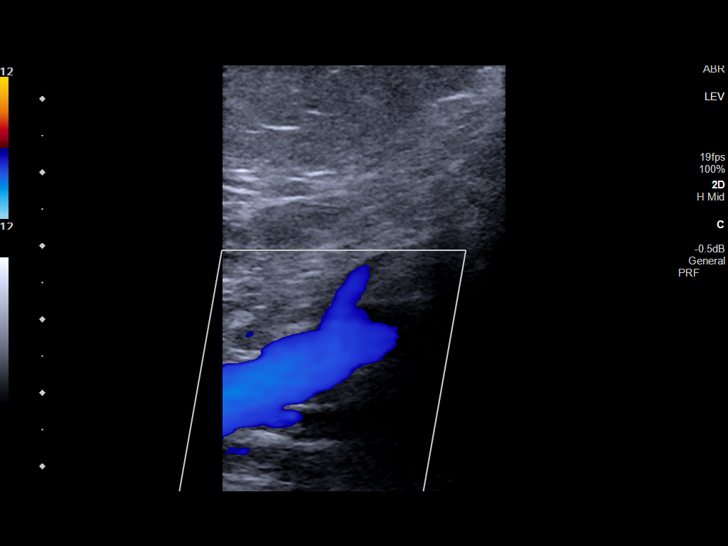
[im 29/35]
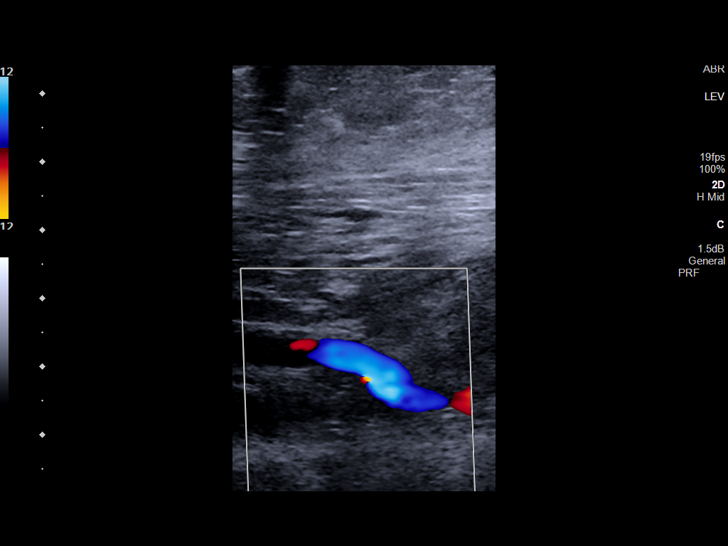
[im 32/35]
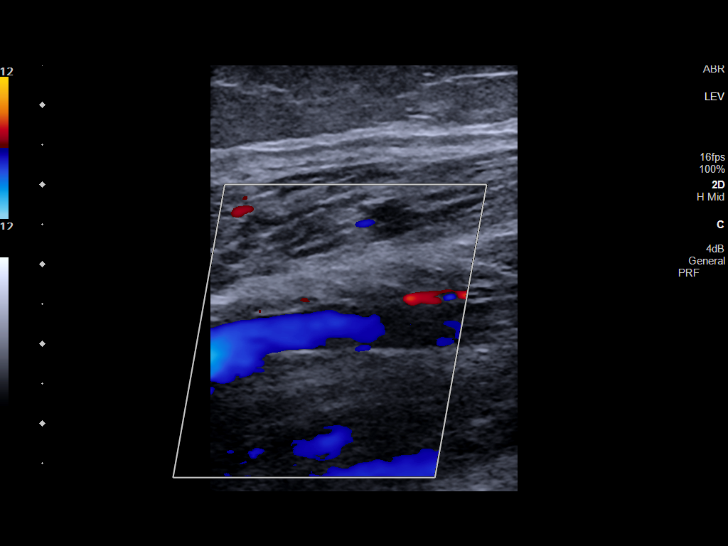
[im 35/35]
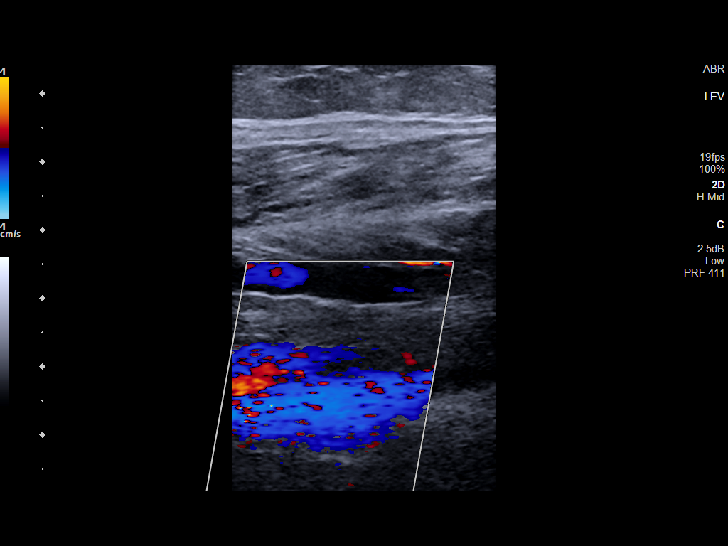

[13 of 24 positions shown; findings below may reference images not displayed]

FINDINGS: Contralateral Common Femoral Vein: Respiratory phasicity is normal
and symmetric with the symptomatic side. No evidence of thrombus.
Normal compressibility.

Common Femoral Vein: No evidence of thrombus. Normal
compressibility, respiratory phasicity and response to augmentation.

Saphenofemoral Junction: No evidence of thrombus. Normal
compressibility and flow on color Doppler imaging.

Profunda Femoral Vein: No evidence of thrombus. Normal
compressibility and flow on color Doppler imaging.

Femoral Vein: No evidence of thrombus. Normal compressibility,
respiratory phasicity and response to augmentation.

Popliteal Vein: No evidence of thrombus. Normal compressibility,
respiratory phasicity and response to augmentation.

Calf Veins: No evidence of thrombus. Normal compressibility and flow
on color Doppler imaging.
IMPRESSION: No evidence of deep venous thrombosis.

## 2022-09-22 ENCOUNTER — Ambulatory Visit: Payer: Medicare HMO | Attending: Otolaryngology

## 2022-09-22 DIAGNOSIS — G4761 Periodic limb movement disorder: Secondary | ICD-10-CM | POA: Diagnosis not present

## 2022-09-22 DIAGNOSIS — G4733 Obstructive sleep apnea (adult) (pediatric): Secondary | ICD-10-CM | POA: Diagnosis not present

## 2022-09-22 DIAGNOSIS — G4736 Sleep related hypoventilation in conditions classified elsewhere: Secondary | ICD-10-CM | POA: Insufficient documentation

## 2023-02-12 ENCOUNTER — Other Ambulatory Visit: Payer: Self-pay | Admitting: Internal Medicine

## 2023-02-12 DIAGNOSIS — Z1231 Encounter for screening mammogram for malignant neoplasm of breast: Secondary | ICD-10-CM

## 2023-05-18 ENCOUNTER — Other Ambulatory Visit: Payer: Self-pay | Admitting: Internal Medicine

## 2023-05-18 DIAGNOSIS — Z1231 Encounter for screening mammogram for malignant neoplasm of breast: Secondary | ICD-10-CM
